# Patient Record
Sex: Female | Born: 1940 | Race: White | Hispanic: No | Marital: Married | Smoking: Never smoker
Health system: Southern US, Community
[De-identification: ages and names within clinical notes are randomized; demographics above are authoritative.]

## PROBLEM LIST (undated history)

## (undated) DIAGNOSIS — M1990 Unspecified osteoarthritis, unspecified site: Secondary | ICD-10-CM

## (undated) DIAGNOSIS — Z7901 Long term (current) use of anticoagulants: Secondary | ICD-10-CM

## (undated) DIAGNOSIS — I639 Cerebral infarction, unspecified: Secondary | ICD-10-CM

## (undated) DIAGNOSIS — Z5189 Encounter for other specified aftercare: Secondary | ICD-10-CM

## (undated) DIAGNOSIS — H25019 Cortical age-related cataract, unspecified eye: Secondary | ICD-10-CM

## (undated) DIAGNOSIS — I82220 Acute embolism and thrombosis of inferior vena cava: Secondary | ICD-10-CM

## (undated) DIAGNOSIS — Z9884 Bariatric surgery status: Secondary | ICD-10-CM

## (undated) DIAGNOSIS — I10 Essential (primary) hypertension: Secondary | ICD-10-CM

## (undated) DIAGNOSIS — I4891 Unspecified atrial fibrillation: Secondary | ICD-10-CM

## (undated) DIAGNOSIS — E119 Type 2 diabetes mellitus without complications: Secondary | ICD-10-CM

## (undated) DIAGNOSIS — O2230 Deep phlebothrombosis in pregnancy, unspecified trimester: Secondary | ICD-10-CM

## (undated) HISTORY — DX: Cerebral infarction, unspecified: I63.9

## (undated) HISTORY — PX: ABDOMINAL HYSTERECTOMY: SHX81

## (undated) HISTORY — PX: TONSILLECTOMY: SUR1361

## (undated) HISTORY — PX: OTHER SURGICAL HISTORY: SHX169

## (undated) HISTORY — PX: COLONOSCOPY: SHX174

## (undated) HISTORY — PX: HIP FRACTURE SURGERY: SHX118

## (undated) HISTORY — PX: KNEE SURGERY: SHX244

## (undated) HISTORY — PX: CHOLECYSTECTOMY: SHX55

## (undated) HISTORY — PX: TOTAL HIP ARTHROPLASTY: SHX124

## (undated) HISTORY — PX: HERNIA REPAIR: SHX51

---

## 1972-04-26 HISTORY — PX: BREAST BIOPSY: SHX20

## 2004-02-11 ENCOUNTER — Ambulatory Visit: Payer: Self-pay | Admitting: Internal Medicine

## 2005-10-01 ENCOUNTER — Ambulatory Visit: Payer: Self-pay | Admitting: *Deleted

## 2005-10-26 ENCOUNTER — Ambulatory Visit: Payer: Self-pay | Admitting: *Deleted

## 2010-07-08 ENCOUNTER — Ambulatory Visit: Payer: Self-pay | Admitting: *Deleted

## 2011-05-12 HISTORY — PX: JOINT REPLACEMENT: SHX530

## 2011-06-04 DIAGNOSIS — I4891 Unspecified atrial fibrillation: Secondary | ICD-10-CM | POA: Insufficient documentation

## 2011-08-25 ENCOUNTER — Ambulatory Visit: Payer: Self-pay | Admitting: *Deleted

## 2012-08-19 DIAGNOSIS — I10 Essential (primary) hypertension: Secondary | ICD-10-CM | POA: Insufficient documentation

## 2012-08-19 DIAGNOSIS — I81 Portal vein thrombosis: Secondary | ICD-10-CM | POA: Insufficient documentation

## 2013-01-03 DIAGNOSIS — Z95828 Presence of other vascular implants and grafts: Secondary | ICD-10-CM | POA: Insufficient documentation

## 2013-08-16 DIAGNOSIS — E1140 Type 2 diabetes mellitus with diabetic neuropathy, unspecified: Secondary | ICD-10-CM | POA: Insufficient documentation

## 2013-08-31 ENCOUNTER — Ambulatory Visit: Payer: Self-pay | Admitting: Family Medicine

## 2014-10-15 ENCOUNTER — Other Ambulatory Visit: Payer: Self-pay | Admitting: Family Medicine

## 2014-10-15 ENCOUNTER — Ambulatory Visit
Admission: RE | Admit: 2014-10-15 | Discharge: 2014-10-15 | Disposition: A | Discharge status: Home / Self Care | Payer: Medicare PPO | Source: Ambulatory Visit | Attending: Family Medicine | Admitting: Family Medicine

## 2014-10-15 DIAGNOSIS — I82411 Acute embolism and thrombosis of right femoral vein: Secondary | ICD-10-CM | POA: Diagnosis not present

## 2014-10-15 DIAGNOSIS — R600 Localized edema: Secondary | ICD-10-CM

## 2014-10-22 ENCOUNTER — Other Ambulatory Visit: Payer: Self-pay | Admitting: Family Medicine

## 2014-10-22 DIAGNOSIS — Z1231 Encounter for screening mammogram for malignant neoplasm of breast: Secondary | ICD-10-CM

## 2014-10-23 ENCOUNTER — Ambulatory Visit
Admission: RE | Admit: 2014-10-23 | Discharge: 2014-10-23 | Disposition: A | Discharge status: Home / Self Care | Payer: Medicare PPO | Source: Ambulatory Visit | Attending: Family Medicine | Admitting: Family Medicine

## 2014-10-23 DIAGNOSIS — Z1231 Encounter for screening mammogram for malignant neoplasm of breast: Secondary | ICD-10-CM | POA: Insufficient documentation

## 2015-08-05 DIAGNOSIS — Z9884 Bariatric surgery status: Secondary | ICD-10-CM | POA: Insufficient documentation

## 2016-02-05 ENCOUNTER — Ambulatory Visit
Admission: RE | Admit: 2016-02-05 | Discharge: 2016-02-05 | Disposition: A | Discharge status: Home / Self Care | Payer: Medicare PPO | Source: Ambulatory Visit | Attending: Pediatrics | Admitting: Pediatrics

## 2016-02-05 ENCOUNTER — Other Ambulatory Visit: Payer: Self-pay | Admitting: Pediatrics

## 2016-02-05 DIAGNOSIS — Z1231 Encounter for screening mammogram for malignant neoplasm of breast: Secondary | ICD-10-CM | POA: Diagnosis not present

## 2016-06-05 ENCOUNTER — Encounter: Payer: Self-pay | Admitting: *Deleted

## 2016-06-08 ENCOUNTER — Ambulatory Visit
Admission: RE | Admit: 2016-06-08 | Discharge: 2016-06-08 | Disposition: A | Discharge status: Home / Self Care | Payer: Medicare Other | Source: Ambulatory Visit | Attending: Gastroenterology | Admitting: Gastroenterology

## 2016-06-08 ENCOUNTER — Ambulatory Visit: Payer: Medicare Other | Admitting: Anesthesiology

## 2016-06-08 ENCOUNTER — Encounter
Admission: RE | Disposition: A | Discharge status: Home / Self Care | Payer: Self-pay | Source: Ambulatory Visit | Attending: Gastroenterology

## 2016-06-08 DIAGNOSIS — Z1211 Encounter for screening for malignant neoplasm of colon: Secondary | ICD-10-CM | POA: Insufficient documentation

## 2016-06-08 DIAGNOSIS — Z800 Family history of malignant neoplasm of digestive organs: Secondary | ICD-10-CM | POA: Diagnosis not present

## 2016-06-08 DIAGNOSIS — Z7901 Long term (current) use of anticoagulants: Secondary | ICD-10-CM | POA: Diagnosis not present

## 2016-06-08 DIAGNOSIS — K5730 Diverticulosis of large intestine without perforation or abscess without bleeding: Secondary | ICD-10-CM | POA: Diagnosis not present

## 2016-06-08 DIAGNOSIS — E119 Type 2 diabetes mellitus without complications: Secondary | ICD-10-CM | POA: Diagnosis not present

## 2016-06-08 DIAGNOSIS — Z801 Family history of malignant neoplasm of trachea, bronchus and lung: Secondary | ICD-10-CM | POA: Diagnosis not present

## 2016-06-08 DIAGNOSIS — I10 Essential (primary) hypertension: Secondary | ICD-10-CM | POA: Insufficient documentation

## 2016-06-08 DIAGNOSIS — Z79899 Other long term (current) drug therapy: Secondary | ICD-10-CM | POA: Insufficient documentation

## 2016-06-08 DIAGNOSIS — Z9884 Bariatric surgery status: Secondary | ICD-10-CM | POA: Insufficient documentation

## 2016-06-08 DIAGNOSIS — I4891 Unspecified atrial fibrillation: Secondary | ICD-10-CM | POA: Insufficient documentation

## 2016-06-08 DIAGNOSIS — M1990 Unspecified osteoarthritis, unspecified site: Secondary | ICD-10-CM | POA: Insufficient documentation

## 2016-06-08 DIAGNOSIS — D123 Benign neoplasm of transverse colon: Secondary | ICD-10-CM | POA: Diagnosis not present

## 2016-06-08 HISTORY — DX: Acute embolism and thrombosis of inferior vena cava: I82.220

## 2016-06-08 HISTORY — DX: Unspecified atrial fibrillation: I48.91

## 2016-06-08 HISTORY — DX: Bariatric surgery status: Z98.84

## 2016-06-08 HISTORY — DX: Essential (primary) hypertension: I10

## 2016-06-08 HISTORY — PX: COLONOSCOPY WITH PROPOFOL: SHX5780

## 2016-06-08 HISTORY — DX: Long term (current) use of anticoagulants: Z79.01

## 2016-06-08 HISTORY — DX: Encounter for other specified aftercare: Z51.89

## 2016-06-08 HISTORY — DX: Deep phlebothrombosis in pregnancy, unspecified trimester: O22.30

## 2016-06-08 HISTORY — DX: Type 2 diabetes mellitus without complications: E11.9

## 2016-06-08 HISTORY — DX: Unspecified osteoarthritis, unspecified site: M19.90

## 2016-06-08 HISTORY — DX: Cortical age-related cataract, unspecified eye: H25.019

## 2016-06-08 LAB — GLUCOSE, CAPILLARY: GLUCOSE-CAPILLARY: 84 mg/dL (ref 65–99)

## 2016-06-08 SURGERY — COLONOSCOPY WITH PROPOFOL
Anesthesia: General

## 2016-06-08 MED ORDER — PROPOFOL 500 MG/50ML IV EMUL
INTRAVENOUS | Status: AC
  Filled 2016-06-08: qty 50

## 2016-06-08 MED ORDER — SODIUM CHLORIDE 0.9 % IV SOLN
INTRAVENOUS | Status: DC
  Administered 2016-06-08: 10:00:00 via INTRAVENOUS

## 2016-06-08 MED ORDER — SODIUM CHLORIDE 0.9 % IV SOLN: INTRAVENOUS | Status: DC

## 2016-06-08 MED ORDER — EPHEDRINE SULFATE 50 MG/ML IJ SOLN
INTRAMUSCULAR | Status: DC | PRN
  Administered 2016-06-08: 10 mg via INTRAVENOUS

## 2016-06-08 MED ORDER — LACTATED RINGERS IV SOLN
INTRAVENOUS | Status: DC | PRN
  Administered 2016-06-08: 12:00:00 via INTRAVENOUS

## 2016-06-08 MED ORDER — PROPOFOL 10 MG/ML IV BOLUS
INTRAVENOUS | Status: AC
  Filled 2016-06-08: qty 20

## 2016-06-08 MED ORDER — PROPOFOL 10 MG/ML IV BOLUS
INTRAVENOUS | Status: DC | PRN
  Administered 2016-06-08: 50 mg via INTRAVENOUS
  Administered 2016-06-08: 30 mg via INTRAVENOUS

## 2016-06-08 MED ORDER — PROPOFOL 500 MG/50ML IV EMUL
INTRAVENOUS | Status: DC | PRN
  Administered 2016-06-08: 80 ug/kg/min via INTRAVENOUS

## 2016-06-08 MED ORDER — EPHEDRINE 5 MG/ML INJ
INTRAVENOUS | Status: AC
  Filled 2016-06-08: qty 10

## 2016-06-08 NOTE — Anesthesia Preprocedure Evaluation (Signed)
Anesthesia Evaluation  Patient identified by MRN, date of birth, ID band Patient awake    Reviewed: Allergy & Precautions, NPO status , Patient's Chart, lab work & pertinent test results, reviewed documented beta blocker date and time   Airway Mallampati: III  TM Distance: >3 FB     Dental  (+) Chipped   Pulmonary           Cardiovascular hypertension, Pt. on medications + dysrhythmias Atrial Fibrillation      Neuro/Psych    GI/Hepatic   Endo/Other  diabetes, Type 2  Renal/GU      Musculoskeletal  (+) Arthritis ,   Abdominal   Peds  Hematology   Anesthesia Other Findings On Xarelto.  Reproductive/Obstetrics                             Anesthesia Physical Anesthesia Plan  ASA: III  Anesthesia Plan: General   Post-op Pain Management:    Induction: Intravenous  Airway Management Planned: Nasal Cannula  Additional Equipment:   Intra-op Plan:   Post-operative Plan:   Informed Consent: I have reviewed the patients History and Physical, chart, labs and discussed the procedure including the risks, benefits and alternatives for the proposed anesthesia with the patient or authorized representative who has indicated his/her understanding and acceptance.     Plan Discussed with: CRNA  Anesthesia Plan Comments:         Anesthesia Quick Evaluation

## 2016-06-08 NOTE — H&P (Signed)
Outpatient short stay form Pre-procedure 06/08/2016 11:11 AM Terri Sails MD  Primary Physician: Dr. Edison Simon  Reason for visit:  Colonoscopy  History of present illness:  Terri Gates history of colon cancer in primary relative. Patient is a 76 year old female presenting today as above. Terri Gates history of lung cancer in her father. She tolerated her prep well. She does take Xarelto but is held that for 3 days now. She takes no other thinning agents or aspirin product.    Current Facility-Administered Medications:  .  0.9 %  sodium chloride infusion, , Intravenous, Continuous, Terri Sails, MD, Last Rate: 20 mL/hr at 06/08/16 1019 .  0.9 %  sodium chloride infusion, , Intravenous, Continuous, Terri Sails, MD  Prescriptions Prior to Admission  Medication Sig Dispense Refill Last Dose  . losartan (COZAAR) 100 MG tablet Take 100 mg by mouth daily.   06/08/2016 at Unknown time  . rivaroxaban (XARELTO) 20 MG TABS tablet Take 20 mg by mouth daily with breakfast.   06/04/2016  . triamcinolone ointment (KENALOG) 0.1 % Apply 1 application topically as needed.   Not Taking at Unknown time     Allergies  Allergen Reactions  . Adhesive [Tape]   . Betadine [Povidone Iodine]   . Celebrex [Celecoxib]   . Cephalosporins   . Cortizone-10 [Hydrocortisone]   . Ivp Dye [Iodinated Diagnostic Agents]   . Keflex [Cephalexin]   . Lanolin   . Neomycin   . Neosporin [Neomycin-Bacitracin Zn-Polymyx]   . Nickel   . Sulfa Antibiotics      Past Medical History:  Diagnosis Date  . Arthritis   . Atrial fibrillation ([place])   . Cataract cortical, senile   . Chronic anticoagulation   . Diabetes mellitus without complication ([place])   . DVT (deep vein thrombosis) in pregnancy ([place])   . Encounter for blood transfusion   . Gastric bypass status for obesity   . Hypertension   . Inferior vena cava embolism (HCC)     Review of systems:      Physical Exam    Heart and lungs: Irregularly  irregular, bilaterally clear    HEENT: Normocephalic atraumatic eyes are anicteric    Other:     Pertinant exam for procedure: Soft nontender nondistended bowel sounds positive normoactive.    Planned proceedures: Colonoscopy and indicated procedures. I have discussed the risks benefits and complications of procedures to include not limited to bleeding, infection, perforation and the risk of sedation and the patient wishes to proceed.    Terri Sails, MD Gastroenterology 06/08/2016  11:11 AM

## 2016-06-08 NOTE — Anesthesia Postprocedure Evaluation (Signed)
Anesthesia Post Note  Patient: Terri Gates  Procedure(s) Performed: Procedure(s) (LRB): COLONOSCOPY WITH PROPOFOL (N/A)  Patient location during evaluation: Endoscopy Anesthesia Type: General Level of consciousness: awake and alert Pain management: pain level controlled Vital Signs Assessment: post-procedure vital signs reviewed and stable Respiratory status: spontaneous breathing, nonlabored ventilation, respiratory function stable and patient connected to nasal cannula oxygen Cardiovascular status: blood pressure returned to baseline and stable Postop Assessment: no signs of nausea or vomiting Anesthetic complications: no     Last Vitals:  Vitals:   06/08/16 1226 06/08/16 1246  BP: (!) 156/75 (!) 203/83  Pulse:    Resp:    Temp: (!) 35.6 C     Last Pain:  Vitals:   06/08/16 1226  TempSrc: Tympanic                 Jonelle Bann S

## 2016-06-08 NOTE — Op Note (Signed)
[facility] Gastroenterology Patient Name: Terri Gates Procedure Date: 06/08/2016 11:17 AM MRN: [id number] Account #: [id number] Date of Birth: 1941/01/13 Admit Type: Outpatient Age: 76 Room: [facility] ENDO ROOM 3 Gender: Female Note Status: Finalized Procedure:            Colonoscopy Indications:          Family history of colon cancer in a first-degree                        relative Providers:            Lollie Sails, MD Referring MD:         Ane Payment, MD (Referring MD) Medicines:            Monitored Anesthesia Care Complications:        No immediate complications. Procedure:            Pre-Anesthesia Assessment:                       - ASA Grade Assessment: III - A patient with severe                        systemic disease.                       After obtaining informed consent, the colonoscope was                        passed under direct vision. Throughout the procedure,                        the patient's blood pressure, pulse, and oxygen                        saturations were monitored continuously. The                        Colonoscope was introduced through the anus and                        advanced to the the cecum, identified by appendiceal                        orifice and ileocecal valve. The colonoscopy was                        unusually difficult due to poor bowel prep, significant                        looping and a tortuous colon. Successful completion of                        the procedure was aided by changing the patient to a                        supine position, changing the patient to a prone                        position, using manual pressure and lavage. The patient  tolerated the procedure well. The quality of the bowel                        preparation was fair. Findings:      A 4 mm polyp was found in the hepatic flexure. The polyp was sessile.       The polyp was removed with a cold  snare. Resection and retrieval were       complete.      A 3 mm polyp was found in the proximal transverse colon. The polyp was       sessile. The polyp was removed with a cold biopsy forceps. Resection and       retrieval were complete.      A 5 mm polyp was found in the hepatic flexure. The polyp was sessile.       The polyp was removed with a cold snare. Resection and retrieval were       complete.      Multiple medium-mouthed diverticula were found in the sigmoid colon,       descending colon, splenic flexure and transverse colon.      The digital rectal exam was normal.      The retroflexed view of the distal rectum and anal verge was normal and       showed no anal or rectal abnormalities. Impression:           - Preparation of the colon was fair.                       - One 4 mm polyp at the hepatic flexure, removed with a                        cold snare. Resected and retrieved.                       - One 3 mm polyp in the proximal transverse colon,                        removed with a cold biopsy forceps. Resected and                        retrieved.                       - One 5 mm polyp at the hepatic flexure, removed with a                        cold snare. Resected and retrieved.                       - Diverticulosis in the sigmoid colon, in the                        descending colon, at the splenic flexure and in the                        transverse colon.                       - The distal rectum and anal verge are normal on  retroflexion view. Recommendation:       - Discharge patient to home. Procedure Code(s):    --- Professional ---                       [phone], Colonoscopy, flexible; with removal of tumor(s),                        polyp(s), or other lesion(s) by snare technique                       [id number], 53, Colonoscopy, flexible; with biopsy, single                        or multiple Diagnosis Code(s):    --- Professional ---                        D12.3, Benign neoplasm of transverse colon (hepatic                        flexure or splenic flexure)                       Z80.0, Family history of malignant neoplasm of                        digestive organs                       K57.30, Diverticulosis of large intestine without                        perforation or abscess without bleeding CPT copyright 2016 American Medical Association. All rights reserved. The codes documented in this report are preliminary and upon coder review may  be revised to meet current compliance requirements. Lollie Sails, MD 06/08/2016 12:22:49 PM This report has been signed electronically. Number of Addenda: 0 Note Initiated On: 06/08/2016 11:17 AM Scope Withdrawal Time: 0 hours 6 minutes 17 seconds  Total Procedure Duration: 0 hours 43 minutes 32 seconds       [facility]

## 2016-06-08 NOTE — Anesthesia Post-op Follow-up Note (Cosign Needed)
Anesthesia QCDR form completed.        

## 2016-06-08 NOTE — Transfer of Care (Signed)
Immediate Anesthesia Transfer of Care Note  Patient: Terri Gates  Procedure(s) Performed: Procedure(s): COLONOSCOPY WITH PROPOFOL (N/A)  Patient Location: PACU  Anesthesia Type:General  Level of Consciousness: sedated  Airway & Oxygen Therapy: Patient Spontanous Breathing and Patient connected to nasal cannula oxygen  Post-op Assessment: Report given to RN and Post -op Vital signs reviewed and stable  Post vital signs: Reviewed and stable  Last Vitals:  Vitals:   06/08/16 0952  BP: (!) 212/98  Pulse: 85  Resp: 18  Temp: 37.1 C    Last Pain:  Vitals:   06/08/16 0952  TempSrc: Tympanic         Complications: No apparent anesthesia complications

## 2016-06-09 ENCOUNTER — Encounter: Payer: Self-pay | Admitting: Gastroenterology

## 2016-06-09 LAB — SURGICAL PATHOLOGY

## 2016-06-15 DIAGNOSIS — I480 Paroxysmal atrial fibrillation: Secondary | ICD-10-CM | POA: Insufficient documentation

## 2016-06-15 DIAGNOSIS — Z8673 Personal history of transient ischemic attack (TIA), and cerebral infarction without residual deficits: Secondary | ICD-10-CM | POA: Insufficient documentation

## 2017-02-11 ENCOUNTER — Encounter: Payer: Self-pay | Admitting: Oncology

## 2017-02-11 ENCOUNTER — Inpatient Hospital Stay: Payer: Medicare Other | Attending: Oncology | Admitting: Oncology

## 2017-02-11 VITALS — BP 128/65 | HR 60 | Temp 97.4°F | Resp 18 | Wt 221.6 lb

## 2017-02-11 DIAGNOSIS — Z79899 Other long term (current) drug therapy: Secondary | ICD-10-CM | POA: Diagnosis not present

## 2017-02-11 DIAGNOSIS — Z9884 Bariatric surgery status: Secondary | ICD-10-CM | POA: Insufficient documentation

## 2017-02-11 DIAGNOSIS — I480 Paroxysmal atrial fibrillation: Secondary | ICD-10-CM | POA: Insufficient documentation

## 2017-02-11 DIAGNOSIS — Z7901 Long term (current) use of anticoagulants: Secondary | ICD-10-CM | POA: Insufficient documentation

## 2017-02-11 DIAGNOSIS — Z86711 Personal history of pulmonary embolism: Secondary | ICD-10-CM | POA: Diagnosis not present

## 2017-02-11 DIAGNOSIS — I482 Chronic atrial fibrillation, unspecified: Secondary | ICD-10-CM

## 2017-02-11 DIAGNOSIS — E669 Obesity, unspecified: Secondary | ICD-10-CM | POA: Insufficient documentation

## 2017-02-11 DIAGNOSIS — I81 Portal vein thrombosis: Secondary | ICD-10-CM | POA: Diagnosis not present

## 2017-02-11 DIAGNOSIS — I10 Essential (primary) hypertension: Secondary | ICD-10-CM | POA: Insufficient documentation

## 2017-02-11 DIAGNOSIS — Z8673 Personal history of transient ischemic attack (TIA), and cerebral infarction without residual deficits: Secondary | ICD-10-CM | POA: Insufficient documentation

## 2017-02-11 DIAGNOSIS — Z86718 Personal history of other venous thrombosis and embolism: Secondary | ICD-10-CM | POA: Diagnosis not present

## 2017-02-11 DIAGNOSIS — Z5181 Encounter for therapeutic drug level monitoring: Secondary | ICD-10-CM

## 2017-02-11 DIAGNOSIS — E119 Type 2 diabetes mellitus without complications: Secondary | ICD-10-CM | POA: Diagnosis not present

## 2017-02-11 DIAGNOSIS — D6852 Prothrombin gene mutation: Secondary | ICD-10-CM

## 2017-02-11 NOTE — Progress Notes (Signed)
Patient here today as a new patient  
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
## 2017-04-08 ENCOUNTER — Ambulatory Visit: Payer: Medicare Other | Admitting: Oncology

## 2017-06-03 ENCOUNTER — Ambulatory Visit: Payer: Medicare Other | Admitting: Oncology
